# Patient Record
Sex: Male | Born: 2005 | Race: White | Hispanic: No | Marital: Single
Health system: Southern US, Community
[De-identification: ages and names within clinical notes are randomized; demographics above are authoritative.]

## PROBLEM LIST (undated history)

## (undated) DIAGNOSIS — H6690 Otitis media, unspecified, unspecified ear: Secondary | ICD-10-CM

## (undated) DIAGNOSIS — F32A Depression, unspecified: Secondary | ICD-10-CM

## (undated) DIAGNOSIS — F419 Anxiety disorder, unspecified: Secondary | ICD-10-CM

## (undated) DIAGNOSIS — F909 Attention-deficit hyperactivity disorder, unspecified type: Secondary | ICD-10-CM

## (undated) HISTORY — PX: OTHER SURGICAL HISTORY: SHX169

## (undated) HISTORY — PX: HERNIA REPAIR: SHX51

---
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
## 2024-10-20 ENCOUNTER — Other Ambulatory Visit: Payer: Self-pay

## 2024-10-20 DIAGNOSIS — S80212A Abrasion, left knee, initial encounter: Secondary | ICD-10-CM | POA: Insufficient documentation

## 2024-10-20 DIAGNOSIS — S0990XA Unspecified injury of head, initial encounter: Secondary | ICD-10-CM | POA: Diagnosis present

## 2024-10-20 DIAGNOSIS — S0181XA Laceration without foreign body of other part of head, initial encounter: Secondary | ICD-10-CM | POA: Diagnosis not present
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
## 2024-10-21 ENCOUNTER — Emergency Department (HOSPITAL_BASED_OUTPATIENT_CLINIC_OR_DEPARTMENT_OTHER)

## 2024-10-21 ENCOUNTER — Emergency Department (HOSPITAL_BASED_OUTPATIENT_CLINIC_OR_DEPARTMENT_OTHER)
Admission: EM | Admit: 2024-10-21 | Discharge: 2024-10-21 | Disposition: A | Discharge status: Home / Self Care | Attending: Emergency Medicine | Admitting: Emergency Medicine

## 2024-10-21 DIAGNOSIS — S8002XA Contusion of left knee, initial encounter: Secondary | ICD-10-CM

## 2024-10-21 DIAGNOSIS — S0181XA Laceration without foreign body of other part of head, initial encounter: Secondary | ICD-10-CM

## 2024-10-21 DIAGNOSIS — S0990XA Unspecified injury of head, initial encounter: Secondary | ICD-10-CM

## 2024-10-21 MED ORDER — LIDOCAINE HCL (PF) 1 % IJ SOLN
5.0000 mL | Freq: Once | INTRAMUSCULAR | Status: AC
  Administered 2024-10-21: 5 mL via INTRADERMAL
  Filled 2024-10-21: qty 5
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
## 2024-10-21 NOTE — Discharge Instructions (Signed)
 Local wound care with bacitracin and dressing changes twice daily.  Wear Ace bandage for comfort and support.  Ice for 20 minutes every 2 hours while awake for the next 2 days.  Sutures are to come out in 6 or 7 days.  Please follow-up with your primary doctor or urgent care for this.  Return to the ER if you develop pus draining from the wound, redness surrounding the wound, high fevers, or for other new and concerning symptoms.
